# Patient Record
Sex: Female | Born: 1970 | Race: White | Hispanic: No | Marital: Married | Smoking: Never smoker
Health system: Southern US, Community
[De-identification: ages and names within clinical notes are randomized; demographics above are authoritative.]

## PROBLEM LIST (undated history)

## (undated) DIAGNOSIS — F419 Anxiety disorder, unspecified: Secondary | ICD-10-CM

## (undated) DIAGNOSIS — D649 Anemia, unspecified: Secondary | ICD-10-CM

## (undated) DIAGNOSIS — I341 Nonrheumatic mitral (valve) prolapse: Secondary | ICD-10-CM

## (undated) DIAGNOSIS — R0600 Dyspnea, unspecified: Secondary | ICD-10-CM

## (undated) DIAGNOSIS — R011 Cardiac murmur, unspecified: Secondary | ICD-10-CM

## (undated) HISTORY — PX: DIAGNOSTIC LAPAROSCOPY: SUR761

## (undated) HISTORY — PX: TUBAL LIGATION: SHX77

## (undated) HISTORY — PX: NOVASURE ABLATION: SHX5394

## (undated) HISTORY — PX: CYSTECTOMY: SUR359

## (undated) HISTORY — PX: ABDOMINAL HYSTERECTOMY: SHX81

---

## 2005-02-14 ENCOUNTER — Emergency Department: Payer: Self-pay | Admitting: Emergency Medicine

## 2005-08-01 ENCOUNTER — Emergency Department: Payer: Self-pay | Admitting: Internal Medicine

## 2005-08-01 ENCOUNTER — Other Ambulatory Visit: Payer: Self-pay

## 2006-05-03 ENCOUNTER — Ambulatory Visit: Payer: Self-pay | Admitting: Internal Medicine

## 2006-05-11 ENCOUNTER — Ambulatory Visit: Payer: Self-pay | Admitting: Internal Medicine

## 2006-11-02 ENCOUNTER — Emergency Department: Payer: Self-pay | Admitting: Emergency Medicine

## 2009-08-21 ENCOUNTER — Emergency Department: Payer: Self-pay | Admitting: Unknown Physician Specialty

## 2010-09-12 ENCOUNTER — Emergency Department: Payer: Self-pay | Admitting: Emergency Medicine

## 2012-03-05 ENCOUNTER — Emergency Department: Payer: Self-pay | Admitting: Emergency Medicine

## 2012-03-14 ENCOUNTER — Emergency Department: Payer: Self-pay | Admitting: Emergency Medicine

## 2013-05-30 ENCOUNTER — Emergency Department: Payer: Self-pay | Admitting: Emergency Medicine

## 2013-05-30 LAB — COMPREHENSIVE METABOLIC PANEL
Albumin: 3.9 g/dL (ref 3.4–5.0)
Anion Gap: 9 (ref 7–16)
Bilirubin,Total: 1.5 mg/dL — ABNORMAL HIGH (ref 0.2–1.0)
Calcium, Total: 8.9 mg/dL (ref 8.5–10.1)
Chloride: 103 mmol/L (ref 98–107)
EGFR (African American): >60
EGFR (Non-African Amer.): >60
Glucose: 89 mg/dL (ref 65–99)
Potassium: 3.4 mmol/L — ABNORMAL LOW (ref 3.5–5.1)
SGPT (ALT): 28 U/L (ref 12–78)

## 2013-05-30 LAB — CBC
HCT: 33.3 % — ABNORMAL LOW (ref 35.0–47.0)
HGB: 11.4 g/dL — ABNORMAL LOW (ref 12.0–16.0)
MCH: 31.2 pg (ref 26.0–34.0)
MCHC: 34.3 g/dL (ref 32.0–36.0)
MCV: 91 fL (ref 80–100)
Platelet: 221 10*3/uL (ref 150–440)
RDW: 14.4 % (ref 11.5–14.5)
WBC: 7.9 10*3/uL (ref 3.6–11.0)

## 2013-05-30 LAB — CK TOTAL AND CKMB (NOT AT ARMC): CK, Total: 85 U/L (ref 21–215)

## 2013-05-30 LAB — TROPONIN I
Troponin-I: <0.02 ng/mL
Troponin-I: <0.02 ng/mL

## 2014-11-30 ENCOUNTER — Emergency Department: Payer: Self-pay | Admitting: Emergency Medicine

## 2019-05-11 ENCOUNTER — Emergency Department: Payer: Self-pay

## 2019-05-11 ENCOUNTER — Other Ambulatory Visit: Payer: Self-pay

## 2019-05-11 ENCOUNTER — Emergency Department
Admission: EM | Admit: 2019-05-11 | Discharge: 2019-05-11 | Disposition: A | Discharge status: Home / Self Care | Payer: Self-pay | Attending: Emergency Medicine | Admitting: Emergency Medicine

## 2019-05-11 ENCOUNTER — Encounter: Payer: Self-pay | Admitting: Emergency Medicine

## 2019-05-11 DIAGNOSIS — T148XXA Other injury of unspecified body region, initial encounter: Secondary | ICD-10-CM | POA: Insufficient documentation

## 2019-05-11 DIAGNOSIS — T07XXXA Unspecified multiple injuries, initial encounter: Secondary | ICD-10-CM

## 2019-05-11 DIAGNOSIS — Y929 Unspecified place or not applicable: Secondary | ICD-10-CM | POA: Insufficient documentation

## 2019-05-11 DIAGNOSIS — S93402A Sprain of unspecified ligament of left ankle, initial encounter: Secondary | ICD-10-CM | POA: Insufficient documentation

## 2019-05-11 DIAGNOSIS — Y999 Unspecified external cause status: Secondary | ICD-10-CM | POA: Insufficient documentation

## 2019-05-11 DIAGNOSIS — W0110XA Fall on same level from slipping, tripping and stumbling with subsequent striking against unspecified object, initial encounter: Secondary | ICD-10-CM | POA: Insufficient documentation

## 2019-05-11 DIAGNOSIS — Y939 Activity, unspecified: Secondary | ICD-10-CM | POA: Insufficient documentation

## 2019-05-11 DIAGNOSIS — S8991XA Unspecified injury of right lower leg, initial encounter: Secondary | ICD-10-CM | POA: Insufficient documentation

## 2019-05-11 HISTORY — DX: Nonrheumatic mitral (valve) prolapse: I34.1

## 2019-05-11 NOTE — ED Notes (Signed)
See triage note  Presents s/p fall 1 week ago  Landed on left knee   Bruising noted to knee  No deformity note  ambulates with slight limp d/t pain

## 2019-05-11 NOTE — ED Provider Notes (Signed)
[facility] Emergency Department Provider Note ____________________________________________  Time seen: Approximately 3:04 PM  I have reviewed the triage vital signs and the nursing notes.   HISTORY  Chief Complaint Knee Pain    HPI Alyssa Salazar is a 48 y.o. female who presents to the emergency department for evaluation and treatment of pain in the right knee and left ankle after a mechanical, non-syncopal fall at work on 05/04/2019.  Patient states that she tripped on a baseboard that is not flush to the ground. She fell forward and struck her right arm on something and landed directly on her right knee. She believes her right foot sort of hit her left ankle during all this and the ankle is tender as well. No relief with OTC medications.   Past Medical History:  Diagnosis Date  . Mitral valve prolapse     There are no active problems to display for this patient.   History reviewed. No pertinent surgical history.  Prior to Admission medications   Not on File    Allergies Penicillins  History reviewed. No pertinent family history.  Social History Social History   Tobacco Use  . Smoking status: Never Smoker  . Smokeless tobacco: Never Used  Substance Use Topics  . Alcohol use: Never    Frequency: Never  . Drug use: Never    Review of Systems Constitutional: Negative for fever. Cardiovascular: Negative for chest pain. Respiratory: Negative for shortness of breath. Musculoskeletal: Positive for right forearm, right knee, and left ankle pain. Skin: Positive for contusions to the right forearm and right knee. Neurological: Negative for decrease in sensation  ____________________________________________   PHYSICAL EXAM:  VITAL SIGNS: ED Triage Vitals  Enc Vitals Group     BP 05/11/19 1342 (!) 147/83     Pulse Rate 05/11/19 1342 89     Resp 05/11/19 1342 16     Temp 05/11/19 1342 98.2 F (36.8 C)     Temp Source 05/11/19 1342 Oral      SpO2 05/11/19 1342 99 %     Weight 05/11/19 1343 180 lb (81.6 kg)     Height 05/11/19 1343 5\' 5"  (1.651 m)     Head Circumference --      Peak Flow --      Pain Score 05/11/19 1342 6     Pain Loc --      Pain Edu? --      Excl. in [place]? --     Constitutional: Alert and oriented. Well appearing and in no acute distress. Eyes: Conjunctivae are clear without discharge or drainage Head: Atraumatic Neck: Supple.  No midline tenderness. Respiratory: No cough. Respirations are even and unlabored. Musculoskeletal: Focal tenderness over the anterior aspect of the right knee in the area of tibial plateau.  Diffuse tenderness over the medial aspect of the left ankle Neurologic: Motor and sensory function is intact. Skin: No open wounds or lesions.  Widespread contusions over the forearm and right knee Psychiatric: Affect and behavior are appropriate.  ____________________________________________   LABS (all labs ordered are listed, but only abnormal results are displayed)  Labs Reviewed - No data to display ____________________________________________  RADIOLOGY  No focal bony abnormality of the right knee or left ankle per radiology. ____________________________________________   PROCEDURES  Procedures  ____________________________________________   INITIAL IMPRESSION / ASSESSMENT AND PLAN / ED COURSE  Alyssa Salazar is a 48 y.o. who presents to the emergency department for treatment and evaluation of right knee pain  and left ankle pain after a fall on 05/04/2019 while at work.  Exam is concerning for a tibial plateau fracture or injury.  X-ray is concerning for a very subtle nondisplaced fracture over the tibial plateau.  Patient was placed in a Jones wrap over the right knee.  Left ankle pain is likely secondary to sprain.  She was placed in an ankle stirrup splint.  She is to follow-up with orthopedics if not improving over the week.  She was provided with a work excuse for  the next 3 days.  She was also sent medications to the pharmacy as below.  She was encouraged to return to the emergency department for symptoms of change or worsen if she is unable to schedule an appointment with her primary care provider or orthopedics.  Medications - No data to display  Pertinent labs & imaging results that were available during my care of the patient were reviewed by me and considered in my medical decision making (see chart for details).  _________________________________________   FINAL CLINICAL IMPRESSION(S) / ED DIAGNOSES  Final diagnoses:  Knee injury, right, initial encounter  Sprain of left ankle, unspecified ligament, initial encounter  Contusion, multiple sites    ED Discharge Orders    None       If controlled substance prescribed during this visit, 12 month history viewed on the [place] prior to issuing an initial prescription for Schedule II or III opiod.   Victorino Dike, FNP 05/11/19 1606    Carrie Mew, MD 05/13/19 [phone]

## 2019-05-11 NOTE — ED Triage Notes (Addendum)
Pt tripped on floor at work, pt works at [employer].  [address] 05/04/19.  Still having pain to right knee. Bruising noted to right knee and right forearm.  No deformity.  Called their nurse line and they told her to ice it but she is still having pain.  Also has pain to left ankle.

## 2019-05-11 NOTE — Discharge Instructions (Signed)
Please follow-up with orthopedics for symptoms that are not improving over the next few days.  Rest, ice, and elevate your extremities as often as possible over the next few days.  Return to the emergency department for symptoms of change or worsen if you are unable to schedule an appointment with your primary care provider or the orthopedic specialist.

## 2019-09-30 IMAGING — DX LEFT ANKLE COMPLETE - 3+ VIEW
3 series · 3 of 3 positions shown · non-contrast
Comparison: None.

CLINICAL DATA: Tripped and fell at work 05/04/2019

EXAM:
LEFT ANKLE COMPLETE - 3+ VIEW

[ankle ap]
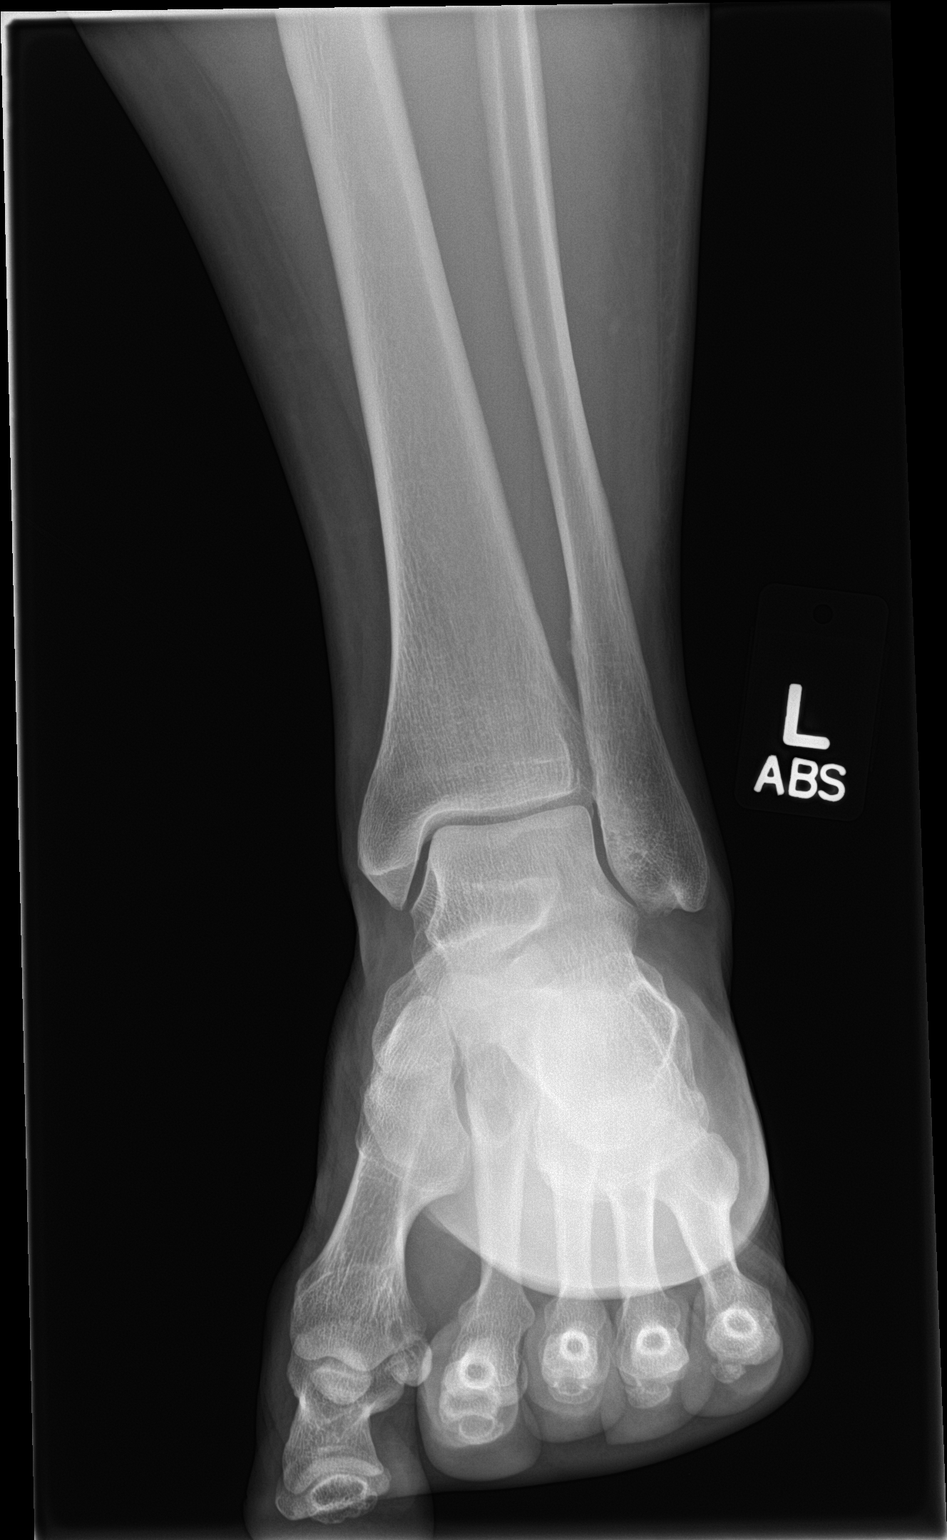

[ankle obl]
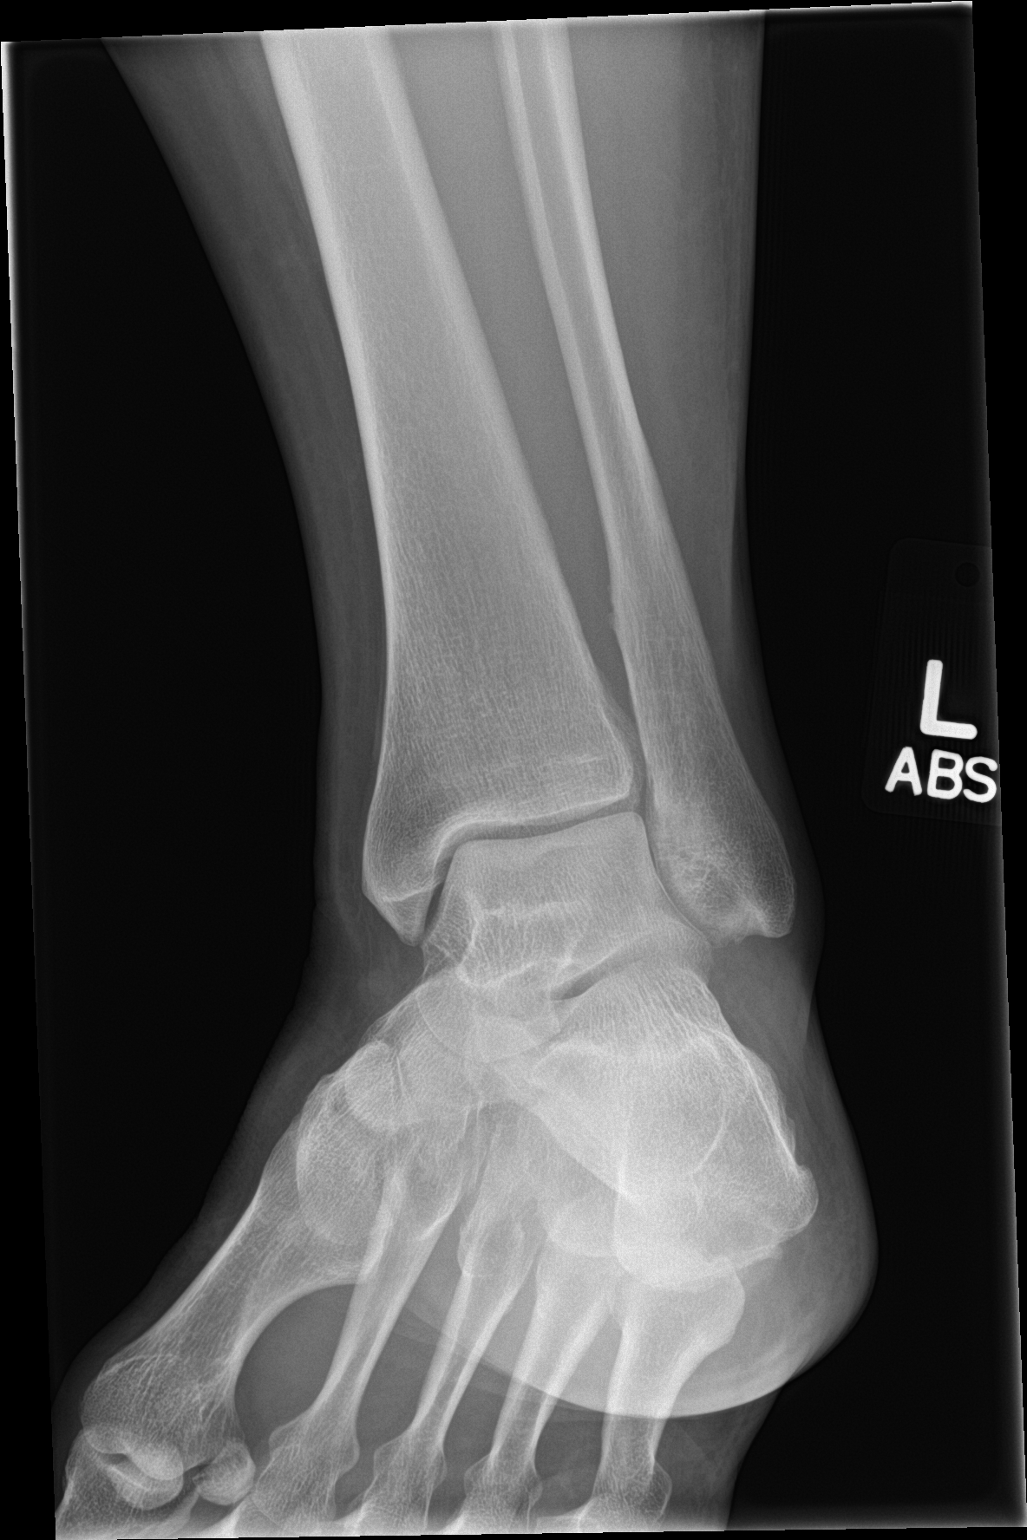

[ankle lat]
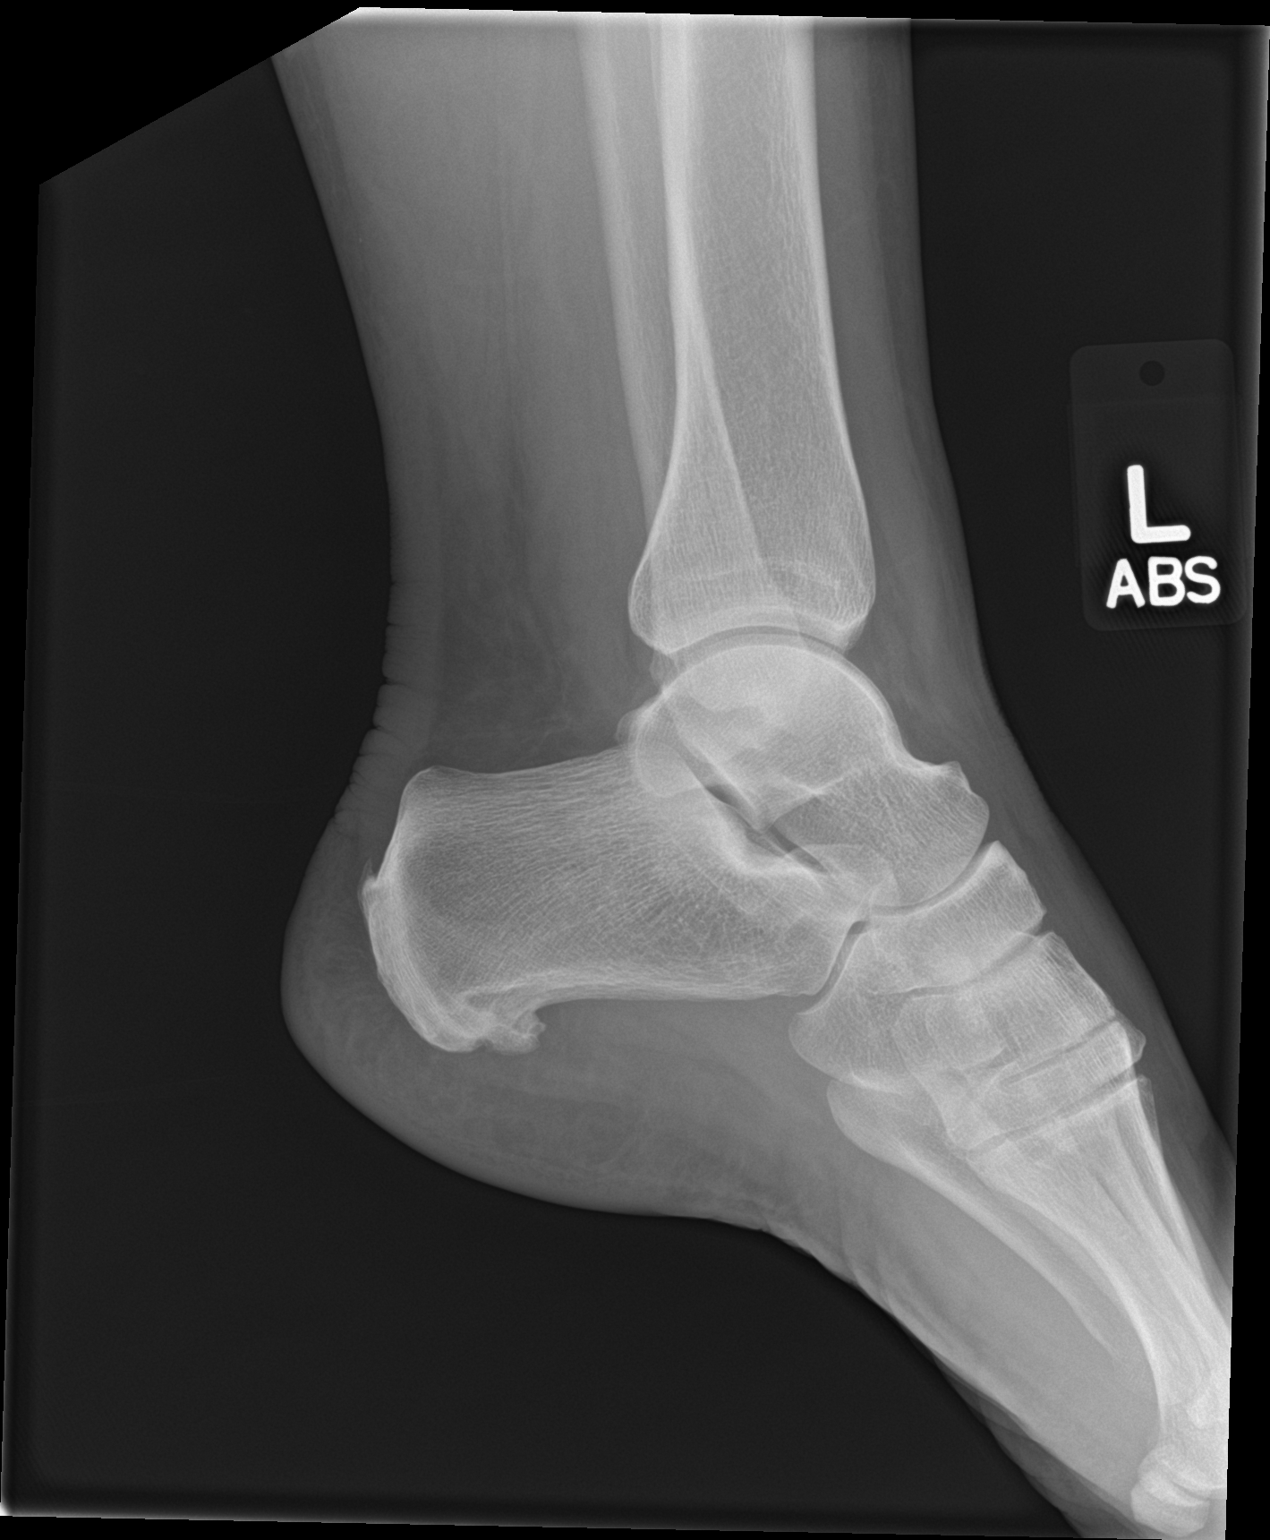

[3 of 3 positions shown; findings below may reference images not displayed]

FINDINGS: The ankle mortise is maintained. No acute ankle fracture or
osteochondral abnormality. The subtalar joints are maintained. Mild
Asiya Wegner. Calcaneal spurring changes are noted.
IMPRESSION: No acute bony findings.

## 2020-02-21 ENCOUNTER — Other Ambulatory Visit: Payer: Self-pay | Admitting: Internal Medicine

## 2020-02-21 DIAGNOSIS — Z0000 Encounter for general adult medical examination without abnormal findings: Secondary | ICD-10-CM

## 2020-02-28 ENCOUNTER — Inpatient Hospital Stay
Admission: RE | Admit: 2020-02-28 | Discharge: 2020-02-28 | Disposition: A | Discharge status: Home / Self Care | Payer: Self-pay | Source: Ambulatory Visit | Attending: *Deleted | Admitting: *Deleted

## 2020-02-28 ENCOUNTER — Other Ambulatory Visit: Payer: Self-pay | Admitting: *Deleted

## 2020-02-28 DIAGNOSIS — Z1231 Encounter for screening mammogram for malignant neoplasm of breast: Secondary | ICD-10-CM

## 2020-03-06 ENCOUNTER — Other Ambulatory Visit: Payer: Self-pay

## 2020-03-06 ENCOUNTER — Ambulatory Visit
Admission: RE | Admit: 2020-03-06 | Discharge: 2020-03-06 | Disposition: A | Discharge status: Home / Self Care | Payer: 59 | Source: Ambulatory Visit | Attending: Internal Medicine | Admitting: Internal Medicine

## 2020-03-06 DIAGNOSIS — Z0000 Encounter for general adult medical examination without abnormal findings: Secondary | ICD-10-CM

## 2020-03-06 DIAGNOSIS — Z1231 Encounter for screening mammogram for malignant neoplasm of breast: Secondary | ICD-10-CM | POA: Insufficient documentation

## 2020-04-19 ENCOUNTER — Other Ambulatory Visit: Payer: Self-pay | Admitting: Obstetrics and Gynecology

## 2020-05-15 ENCOUNTER — Other Ambulatory Visit: Payer: 59

## 2020-05-23 ENCOUNTER — Other Ambulatory Visit: Payer: 59

## 2020-05-27 NOTE — H&P (Signed)
Chief Complaint:   Alyssa Salazar is a 49 y.o. female presenting with Pre Op Consulting   History of Present Illness: Patient returns for a preoperative visit for a Total Laparoscopic Hysterectomy with Bilateral Salpingectomy. Indications are menorrhagia, uterine fibroids, s/p failed ablation 6 years ago; patient declines hormonal management.   I sent in Aygestin 5mg  04/16/2020 to manage patient's bleeding until surgery, but she is still having clotting that annoys.  Workup: Pap:03/2020 neg/neg EMBx: collect today TVUS 03/2020: Lt lat  Ant fibroid= 16 mm Endometrium=13.88 mm bil ovs wnl  Pertinent hx: -Hx of Laparoscopic Ovarian Cystectomy -S/p Novasure at [facility] 6 years ago and helped some with period but her periods are progressively getting heavier again -S/p BTLin 1994 -NSVD x3, includes twins    Past Medical History:  has a past medical history of Anemia, Anxiety (012020), and Heart murmur, unspecified (1990).  Past Surgical History:  has a past surgical history that includes Laparoscopic ovarian cystectomy (Right); Tubal ligation (Bilateral, 05/14/1993); and Hysteroscopy w/ endometrial ablation (N/A, 2015). Family History: family history includes COPD in her father and mother; Diabetes type II in her father; Heart disease in her mother; Lung cancer in her father; Prostate cancer in her maternal grandfather; Stroke in her mother; Ulcers in her mother. Social History:  reports that she has never smoked. She has never used smokeless tobacco. She reports that she does not drink alcohol and does not use drugs. OB/GYN History:  OB History    Gravida  3   Para  3   Term  2   Preterm  1   AB      Living  4     SAB      TAB      Ectopic      Molar      Multiple  1   Live Births  4         Allergies: is allergic to iodinated contrast media and penicillins. Medications: Current Outpatient Medications:  .  escitalopram oxalate (LEXAPRO) 10 MG tablet, Take 1  tablet (10 mg total) by mouth once daily for 90 days, Disp: 90 tablet, Rfl: 11 .  norethindrone (AYGESTIN) 5 mg tablet, Take 1 tablet (5 mg total) by mouth once daily, Disp: 30 tablet, Rfl: 2 .  valACYclovir (VALTREX) 1000 MG tablet, Take 1 tablet (1,000 mg total) by mouth 3 (three) times daily as needed, Disp: 30 tablet, Rfl: 11  Review of Systems: No SOB, no palpitations or chest pain, no new lower extremity edema, no nausea or vomiting or bowel or bladder complaints. See HPI for gyn specific ROS.   Exam:   BP 144/79   Pulse 81   Ht 160 cm (5\' 3" )   Wt 89.8 kg (198 lb)   BMI 35.07 kg/m   General: Patient is well-groomed, well-nourished, appears stated age in no acute distress   HEENT: head is atraumatic and normocephalic, trachea is midline, neck is supple with no palpable nodules   CV: Regular rhythm and normal heart rate, no murmur   Pulm: Clear to auscultation throughout lung fields with no wheezing, crackles, or rhonchi. No increased work of breathing  Abdomen: soft , no mass, non-tender, no rebound tenderness, no hepatomegaly  Pelvic: tanner stage 5 ,   External genitalia: vulva /labia no lesions  Urethra: no prolapse  Vagina: normal physiologic d/c, laxity in vaginal walls  Cervix: no lesions, no cervical motion tenderness, good descent  Uterus: normal size shape and contour, non-tender  Adnexa:  no mass,  non-tender    Rectovaginal: External wnl embx collected  Impression:   The encounter diagnosis was Excessive or frequent menstruation.    Plan:    Patient returns for a preoperative discussion regarding her plans to proceed with surgical treatment of her menorrhagia by total laparoscopic hysterectomy with bilateral salpingectomy procedure.  We may perform a cystoscopy to evaluate the urinary tract after the procedure, if surgically indicated for uro tract integrity.   The patient and I discussed the technical aspects of the procedure including the potential for  risks and complications.  These include but are not limited to the risk of infection requiring post-operative antibiotics or further procedures.  We talked about the risk of injury to adjacent organs including bladder, bowel, ureter, blood vessels or nerves.  We talked about the need to convert to an open incision.  We talked about the possible need for blood transfusion.  We talked about postop complications such as thromboembolic or cardiopulmonary complications.  All of her questions were answered.  Her preoperative exam was completed and the appropriate consents were signed. She is scheduled to undergo this procedure in the near future.  Specific Peri-operative Considerations:  - Consent: obtained today - Health Maintenance: emb today - Labs: CBC, CMP preoperatively - Studies: EKG, CXR preoperatively - Bowel Preparation: None required - Abx:  Ancef 2g - VTE ppx: SCDs perioperatively - Glucose Protocol: n/a - Beta-blockade: n/a    Diagnoses and all orders for this visit:  Excessive or frequent menstruation -     Pathology Report - [facility]    Return for Postop check.  Sherrie George, MD

## 2020-05-30 ENCOUNTER — Encounter
Admission: RE | Admit: 2020-05-30 | Discharge: 2020-05-30 | Disposition: A | Discharge status: Home / Self Care | Payer: No Typology Code available for payment source | Source: Ambulatory Visit | Attending: Obstetrics and Gynecology | Admitting: Obstetrics and Gynecology

## 2020-05-30 ENCOUNTER — Other Ambulatory Visit: Payer: Self-pay

## 2020-05-30 DIAGNOSIS — Z01818 Encounter for other preprocedural examination: Secondary | ICD-10-CM | POA: Insufficient documentation

## 2020-05-30 DIAGNOSIS — I341 Nonrheumatic mitral (valve) prolapse: Secondary | ICD-10-CM | POA: Diagnosis not present

## 2020-05-30 HISTORY — DX: Anemia, unspecified: D64.9

## 2020-05-30 HISTORY — DX: Dyspnea, unspecified: R06.00

## 2020-05-30 HISTORY — DX: Anxiety disorder, unspecified: F41.9

## 2020-05-30 LAB — TYPE AND SCREEN
ABO/RH(D): O POS
Antibody Screen: NEGATIVE

## 2020-05-30 LAB — BASIC METABOLIC PANEL
Anion gap: 6 (ref 5–15)
BUN: 16 mg/dL (ref 6–20)
CO2: 26 mmol/L (ref 22–32)
Calcium: 8.5 mg/dL — ABNORMAL LOW (ref 8.9–10.3)
Chloride: 106 mmol/L (ref 98–111)
Creatinine, Ser: 0.87 mg/dL (ref 0.44–1.00)
GFR calc Af Amer: >60 mL/min (ref >60)
GFR calc non Af Amer: >60 mL/min (ref >60)
Glucose, Bld: 98 mg/dL (ref 70–99)
Potassium: 3.8 mmol/L (ref 3.5–5.1)
Sodium: 138 mmol/L (ref 135–145)

## 2020-05-30 LAB — CBC
HCT: 42.2 % (ref 36.0–46.0)
Hemoglobin: 13.4 g/dL (ref 12.0–15.0)
MCH: 26.9 pg (ref 26.0–34.0)
MCHC: 31.8 g/dL (ref 30.0–36.0)
MCV: 84.6 fL (ref 80.0–100.0)
Platelets: 219 10*3/uL (ref 150–400)
RBC: 4.99 MIL/uL (ref 3.87–5.11)
RDW: 13.2 % (ref 11.5–15.5)
WBC: 5.7 10*3/uL (ref 4.0–10.5)
nRBC: 0 % (ref 0.0–0.2)

## 2020-05-30 NOTE — Patient Instructions (Addendum)
Your procedure is scheduled on: 06/10/20 Report to [facility]. To find out your arrival time please call [phone] between 1PM - 3PM on 06/07/20 .  Remember: Instructions that are not followed completely may result in serious medical risk, up to and including death, or upon the discretion of your surgeon and anesthesiologist your surgery may need to be rescheduled.     _X__ 1. Do not eat food after midnight the night before your procedure.                 No gum chewing or hard candies. You may drink clear liquids up to 2 hours                 before you are scheduled to arrive for your surgery- DO not drink clear                 liquids within 2 hours of the start of your surgery.                 Clear Liquids include:  water, apple juice without pulp, clear carbohydrate                 drink such as Clearfast or Gatorade, Black Coffee or Tea (Do not add                 anything to coffee or tea). Diabetics water only  __X__2.  On the morning of surgery brush your teeth with toothpaste and water, you                 may rinse your mouth with mouthwash if you wish.  Do not swallow any              toothpaste of mouthwash.     _X__ 3.  No Alcohol for 24 hours before or after surgery.   _X__ 4.  Do Not Smoke or use e-cigarettes For 24 Hours Prior to Your Surgery.                 Do not use any chewable tobacco products for at least 6 hours prior to                 surgery.  ____  5.  Bring all medications with you on the day of surgery if instructed.   __X__  6.  Notify your doctor if there is any change in your medical condition      (cold, fever, infections).     Do not wear jewelry, make-up, hairpins, clips or nail polish. Do not wear lotions, powders, or perfumes.  Do not shave 48 hours prior to surgery. Men may shave face and neck. Do not bring valuables to the hospital.    [facility] is not responsible for any belongings  or valuables.  Contacts, dentures/partials or body piercings may not be worn into surgery. Bring a case for your contacts, glasses or hearing aids, a denture cup will be supplied. Leave your suitcase in the car. After surgery it may be brought to your room. For patients admitted to the hospital, discharge time is determined by your treatment team.   Patients discharged the day of surgery will not be allowed to drive home.   Please read over the following fact sheets that you were given:   MRSA Information  __X__ Take these medicines the morning of surgery with A SIP OF WATER:  1. none  2.   3.   4.  5.  6.  ____ Fleet Enema (as directed)   __X__ Use CHG Soap/SAGE wipes as directed  ____ Use inhalers on the day of surgery  ____ Stop metformin/Janumet/Farxiga 2 days prior to surgery    ____ Take 1/2 of usual insulin dose the night before surgery. No insulin the morning          of surgery.   ____ Stop Blood Thinners Coumadin/Plavix/Xarelto/Pleta/Pradaxa/Eliquis/Effient/Aspirin  on   Or contact your Surgeon, Cardiologist or Medical Doctor regarding  ability to stop your blood thinners  __X__ Stop Anti-inflammatories 7 days before surgery such as Advil, Ibuprofen, Motrin,  BC or Goodies Powder, Naprosyn, Naproxen, Aleve, Aspirin    __X__ Stop all herbal supplements, fish oil or vitamin E until after surgery.    ____ Bring C-Pap to the hospital.    The Ensure Pre Surgery drink should be completed 2 hours prior to arriving for surgery

## 2020-06-06 ENCOUNTER — Other Ambulatory Visit
Admission: RE | Admit: 2020-06-06 | Discharge: 2020-06-06 | Disposition: A | Discharge status: Home / Self Care | Payer: No Typology Code available for payment source | Source: Ambulatory Visit | Attending: Obstetrics and Gynecology | Admitting: Obstetrics and Gynecology

## 2020-06-06 ENCOUNTER — Other Ambulatory Visit: Payer: Self-pay

## 2020-06-06 DIAGNOSIS — Z01812 Encounter for preprocedural laboratory examination: Secondary | ICD-10-CM | POA: Insufficient documentation

## 2020-06-06 DIAGNOSIS — Z20822 Contact with and (suspected) exposure to covid-19: Secondary | ICD-10-CM | POA: Diagnosis not present

## 2020-06-06 LAB — SARS CORONAVIRUS 2 (TAT 6-24 HRS): SARS Coronavirus 2: NEGATIVE

## 2020-06-10 ENCOUNTER — Ambulatory Visit
Admission: RE | Admit: 2020-06-10 | Discharge: 2020-06-10 | Disposition: A | Discharge status: Home / Self Care | Payer: No Typology Code available for payment source | Attending: Obstetrics and Gynecology | Admitting: Obstetrics and Gynecology

## 2020-06-10 ENCOUNTER — Encounter
Admission: RE | Disposition: A | Discharge status: Home / Self Care | Payer: Self-pay | Source: Home / Self Care | Attending: Obstetrics and Gynecology

## 2020-06-10 ENCOUNTER — Ambulatory Visit: Payer: No Typology Code available for payment source | Admitting: Anesthesiology

## 2020-06-10 ENCOUNTER — Other Ambulatory Visit: Payer: Self-pay

## 2020-06-10 ENCOUNTER — Encounter: Payer: Self-pay | Admitting: Obstetrics and Gynecology

## 2020-06-10 DIAGNOSIS — Z793 Long term (current) use of hormonal contraceptives: Secondary | ICD-10-CM | POA: Diagnosis not present

## 2020-06-10 DIAGNOSIS — Z79899 Other long term (current) drug therapy: Secondary | ICD-10-CM | POA: Insufficient documentation

## 2020-06-10 DIAGNOSIS — N72 Inflammatory disease of cervix uteri: Secondary | ICD-10-CM | POA: Diagnosis not present

## 2020-06-10 DIAGNOSIS — N852 Hypertrophy of uterus: Secondary | ICD-10-CM | POA: Diagnosis not present

## 2020-06-10 DIAGNOSIS — D251 Intramural leiomyoma of uterus: Secondary | ICD-10-CM | POA: Insufficient documentation

## 2020-06-10 DIAGNOSIS — N920 Excessive and frequent menstruation with regular cycle: Secondary | ICD-10-CM | POA: Diagnosis present

## 2020-06-10 DIAGNOSIS — Z91041 Radiographic dye allergy status: Secondary | ICD-10-CM | POA: Insufficient documentation

## 2020-06-10 DIAGNOSIS — Z880 Allergy status to penicillin: Secondary | ICD-10-CM | POA: Insufficient documentation

## 2020-06-10 DIAGNOSIS — F419 Anxiety disorder, unspecified: Secondary | ICD-10-CM | POA: Diagnosis not present

## 2020-06-10 HISTORY — PX: LAPAROSCOPIC HYSTERECTOMY: SHX1926

## 2020-06-10 HISTORY — PX: LAPAROSCOPIC BILATERAL SALPINGECTOMY: SHX5889

## 2020-06-10 LAB — POCT PREGNANCY, URINE: Preg Test, Ur: NEGATIVE

## 2020-06-10 LAB — ABO/RH: ABO/RH(D): O POS

## 2020-06-10 SURGERY — HYSTERECTOMY, TOTAL, LAPAROSCOPIC
Anesthesia: General

## 2020-06-10 MED ORDER — LIDOCAINE HCL (PF) 2 % IJ SOLN
INTRAMUSCULAR | Status: AC
  Filled 2020-06-10: qty 5

## 2020-06-10 MED ORDER — CHLORHEXIDINE GLUCONATE 0.12 % MT SOLN
OROMUCOSAL | Status: AC
  Filled 2020-06-10: qty 15

## 2020-06-10 MED ORDER — OXYCODONE HCL 5 MG PO TABS
5.0000 mg | ORAL_TABLET | Freq: Once | ORAL | Status: AC | PRN
  Administered 2020-06-10: 5 mg via ORAL

## 2020-06-10 MED ORDER — ONDANSETRON HCL 4 MG/2ML IJ SOLN: 4.0000 mg | Freq: Once | INTRAMUSCULAR | Status: DC | PRN

## 2020-06-10 MED ORDER — FENTANYL CITRATE (PF) 100 MCG/2ML IJ SOLN
INTRAMUSCULAR | Status: AC
  Filled 2020-06-10: qty 2

## 2020-06-10 MED ORDER — ACETAMINOPHEN 500 MG PO TABS
1000.0000 mg | ORAL_TABLET | Freq: Four times a day (QID) | ORAL | 0 refills | Status: AC
Start: 2020-06-10 — End: 2020-06-13

## 2020-06-10 MED ORDER — ONDANSETRON HCL 4 MG/2ML IJ SOLN
INTRAMUSCULAR | Status: DC | PRN
  Administered 2020-06-10: 4 mg via INTRAVENOUS

## 2020-06-10 MED ORDER — LACTATED RINGERS IV SOLN: INTRAVENOUS | Status: DC

## 2020-06-10 MED ORDER — DEXAMETHASONE SODIUM PHOSPHATE 10 MG/ML IJ SOLN
INTRAMUSCULAR | Status: AC
  Filled 2020-06-10: qty 1

## 2020-06-10 MED ORDER — LIDOCAINE HCL (CARDIAC) PF 100 MG/5ML IV SOSY
PREFILLED_SYRINGE | INTRAVENOUS | Status: DC | PRN
  Administered 2020-06-10: 100 mg via INTRAVENOUS

## 2020-06-10 MED ORDER — FENTANYL CITRATE (PF) 100 MCG/2ML IJ SOLN
INTRAMUSCULAR | Status: DC | PRN
  Administered 2020-06-10 (×3): 50 ug via INTRAVENOUS

## 2020-06-10 MED ORDER — EPHEDRINE 5 MG/ML INJ
INTRAVENOUS | Status: AC
  Filled 2020-06-10: qty 10

## 2020-06-10 MED ORDER — IBUPROFEN 800 MG PO TABS: 800.0000 mg | ORAL_TABLET | Freq: Three times a day (TID) | ORAL | 1 refills | Status: AC

## 2020-06-10 MED ORDER — OXYCODONE HCL 5 MG/5ML PO SOLN: 5.0000 mg | Freq: Once | ORAL | Status: AC | PRN

## 2020-06-10 MED ORDER — FAMOTIDINE 20 MG PO TABS
20.0000 mg | ORAL_TABLET | Freq: Once | ORAL | Status: AC
  Administered 2020-06-10: 20 mg via ORAL

## 2020-06-10 MED ORDER — ORAL CARE MOUTH RINSE: 15.0000 mL | Freq: Once | OROMUCOSAL | Status: AC

## 2020-06-10 MED ORDER — PROPOFOL 10 MG/ML IV BOLUS
INTRAVENOUS | Status: DC | PRN
  Administered 2020-06-10: 170 mg via INTRAVENOUS

## 2020-06-10 MED ORDER — FENTANYL CITRATE (PF) 100 MCG/2ML IJ SOLN
25.0000 ug | INTRAMUSCULAR | Status: DC | PRN
  Administered 2020-06-10 (×2): 50 ug via INTRAVENOUS

## 2020-06-10 MED ORDER — ACETAMINOPHEN 10 MG/ML IV SOLN: 1000.0000 mg | Freq: Once | INTRAVENOUS | Status: DC | PRN

## 2020-06-10 MED ORDER — CHLORHEXIDINE GLUCONATE 0.12 % MT SOLN
15.0000 mL | Freq: Once | OROMUCOSAL | Status: AC
  Administered 2020-06-10: 15 mL via OROMUCOSAL

## 2020-06-10 MED ORDER — BUPIVACAINE HCL (PF) 0.5 % IJ SOLN
INTRAMUSCULAR | Status: AC
  Filled 2020-06-10: qty 30

## 2020-06-10 MED ORDER — OXYCODONE HCL 5 MG PO TABS: 5.0000 mg | ORAL_TABLET | ORAL | 0 refills | Status: AC | PRN

## 2020-06-10 MED ORDER — MIDAZOLAM HCL 2 MG/2ML IJ SOLN
INTRAMUSCULAR | Status: DC | PRN
  Administered 2020-06-10: 2 mg via INTRAVENOUS

## 2020-06-10 MED ORDER — OXYCODONE HCL 5 MG PO TABS
ORAL_TABLET | ORAL | Status: AC
  Filled 2020-06-10: qty 1

## 2020-06-10 MED ORDER — DEXAMETHASONE SODIUM PHOSPHATE 10 MG/ML IJ SOLN
INTRAMUSCULAR | Status: DC | PRN
  Administered 2020-06-10: 8 mg via INTRAVENOUS

## 2020-06-10 MED ORDER — MIDAZOLAM HCL 2 MG/2ML IJ SOLN
INTRAMUSCULAR | Status: AC
  Filled 2020-06-10: qty 2

## 2020-06-10 MED ORDER — GABAPENTIN 800 MG PO TABS: 800.0000 mg | ORAL_TABLET | Freq: Every day | ORAL | 0 refills | Status: AC

## 2020-06-10 MED ORDER — EPHEDRINE SULFATE 50 MG/ML IJ SOLN
INTRAMUSCULAR | Status: DC | PRN
  Administered 2020-06-10: 5 mg via INTRAVENOUS

## 2020-06-10 MED ORDER — PROPOFOL 10 MG/ML IV BOLUS
INTRAVENOUS | Status: AC
  Filled 2020-06-10: qty 20

## 2020-06-10 MED ORDER — DOCUSATE SODIUM 100 MG PO CAPS
100.0000 mg | ORAL_CAPSULE | Freq: Two times a day (BID) | ORAL | 0 refills | Status: AC
Start: 2020-06-10 — End: ?

## 2020-06-10 MED ORDER — CEFAZOLIN SODIUM-DEXTROSE 2-4 GM/100ML-% IV SOLN
INTRAVENOUS | Status: AC
  Filled 2020-06-10: qty 100

## 2020-06-10 MED ORDER — ROCURONIUM BROMIDE 100 MG/10ML IV SOLN
INTRAVENOUS | Status: DC | PRN
  Administered 2020-06-10: 20 mg via INTRAVENOUS
  Administered 2020-06-10: 40 mg via INTRAVENOUS
  Administered 2020-06-10: 20 mg via INTRAVENOUS

## 2020-06-10 MED ORDER — CEFAZOLIN SODIUM-DEXTROSE 2-4 GM/100ML-% IV SOLN
2.0000 g | INTRAVENOUS | Status: AC
  Administered 2020-06-10: 2 g via INTRAVENOUS

## 2020-06-10 MED ORDER — ROCURONIUM BROMIDE 10 MG/ML (PF) SYRINGE
PREFILLED_SYRINGE | INTRAVENOUS | Status: AC
  Filled 2020-06-10: qty 10

## 2020-06-10 MED ORDER — SUGAMMADEX SODIUM 200 MG/2ML IV SOLN
INTRAVENOUS | Status: DC | PRN
  Administered 2020-06-10: 200 mg via INTRAVENOUS

## 2020-06-10 MED ORDER — BUPIVACAINE HCL 0.5 % IJ SOLN
INTRAMUSCULAR | Status: DC | PRN
  Administered 2020-06-10: 15 mL

## 2020-06-10 MED ORDER — FAMOTIDINE 20 MG PO TABS
ORAL_TABLET | ORAL | Status: AC
  Filled 2020-06-10: qty 1

## 2020-06-10 SURGICAL SUPPLY — 68 items
BAG URINE DRAIN 2000ML AR STRL (UROLOGICAL SUPPLIES) ×6 IMPLANT
BLADE SURG SZ11 CARB STEEL (BLADE) ×3 IMPLANT
CATH FOLEY 2WAY  5CC 16FR (CATHETERS) ×1
CATH URTH 16FR FL 2W BLN LF (CATHETERS) ×2 IMPLANT
CHLORAPREP W/TINT 26 (MISCELLANEOUS) ×3 IMPLANT
CORD MONOPOLAR M/FML 12FT (MISCELLANEOUS) ×3 IMPLANT
COUNTER NEEDLE 20/40 LG (NEEDLE) ×3 IMPLANT
COVER LIGHT HANDLE STERIS (MISCELLANEOUS) ×6 IMPLANT
COVER WAND RF STERILE (DRAPES) ×3 IMPLANT
DERMABOND ADVANCED (GAUZE/BANDAGES/DRESSINGS) ×1
DERMABOND ADVANCED .7 DNX12 (GAUZE/BANDAGES/DRESSINGS) ×2 IMPLANT
DEVICE SUTURE ENDOST 10MM (ENDOMECHANICALS) ×3 IMPLANT
DRAPE GENERAL ENDO 106X123.5 (DRAPES) ×3 IMPLANT
DRAPE STERI POUCH LG 24X46 STR (DRAPES) IMPLANT
DRAPE UNDER BUTTOCK W/FLU (DRAPES) ×3 IMPLANT
DRSG TEGADERM 2-3/8X2-3/4 SM (GAUZE/BANDAGES/DRESSINGS) ×9 IMPLANT
GLOVE BIO SURGEON STRL SZ7 (GLOVE) ×9 IMPLANT
GLOVE INDICATOR 7.5 STRL GRN (GLOVE) ×6 IMPLANT
GOWN STRL REUS W/ TWL LRG LVL3 (GOWN DISPOSABLE) ×6 IMPLANT
GOWN STRL REUS W/ TWL XL LVL3 (GOWN DISPOSABLE) ×2 IMPLANT
GOWN STRL REUS W/TWL LRG LVL3 (GOWN DISPOSABLE) ×3
GOWN STRL REUS W/TWL XL LVL3 (GOWN DISPOSABLE) ×1
GRASPER SUT TROCAR 14GX15 (MISCELLANEOUS) ×3 IMPLANT
IRRIGATION STRYKERFLOW (MISCELLANEOUS) ×2 IMPLANT
IRRIGATOR STRYKERFLOW (MISCELLANEOUS) ×3
IV NS 1000ML (IV SOLUTION) ×1
IV NS 1000ML BAXH (IV SOLUTION) ×2 IMPLANT
KIT PINK PAD W/HEAD ARE REST (MISCELLANEOUS) ×3
KIT PINK PAD W/HEAD ARM REST (MISCELLANEOUS) ×2 IMPLANT
KIT TURNOVER CYSTO (KITS) ×3 IMPLANT
L-HOOK LAP DISP 36CM (ELECTROSURGICAL)
LABEL OR SOLS (LABEL) IMPLANT
LHOOK LAP DISP 36CM (ELECTROSURGICAL) IMPLANT
LIGASURE VESSEL 5MM BLUNT TIP (ELECTROSURGICAL) ×3 IMPLANT
MANIPULATOR VCARE LG CRV RETR (MISCELLANEOUS) IMPLANT
MANIPULATOR VCARE SML CRV RETR (MISCELLANEOUS) IMPLANT
MANIPULATOR VCARE STD CRV RETR (MISCELLANEOUS) ×3 IMPLANT
NEEDLE FILTER BLUNT 18X 1/2SAF (NEEDLE) ×1
NEEDLE FILTER BLUNT 18X1 1/2 (NEEDLE) ×2 IMPLANT
NS IRRIG 500ML POUR BTL (IV SOLUTION) ×3 IMPLANT
OCCLUDER COLPOPNEUMO (BALLOONS) ×3 IMPLANT
PACK GYN LAPAROSCOPIC (MISCELLANEOUS) ×3 IMPLANT
PAD OB MATERNITY 4.3X12.25 (PERSONAL CARE ITEMS) ×3 IMPLANT
PAD PREP 24X41 OB/GYN DISP (PERSONAL CARE ITEMS) ×3 IMPLANT
PENCIL ELECTRO HAND CTR (MISCELLANEOUS) ×3 IMPLANT
POUCH SPECIMEN RETRIEVAL 10MM (ENDOMECHANICALS) IMPLANT
SCISSORS METZENBAUM CVD 33 (INSTRUMENTS) ×3 IMPLANT
SET CYSTO W/LG BORE CLAMP LF (SET/KITS/TRAYS/PACK) IMPLANT
SET TUBE SMOKE EVAC HIGH FLOW (TUBING) ×3 IMPLANT
SLEEVE ENDOPATH XCEL 5M (ENDOMECHANICALS) ×3 IMPLANT
SPONGE GAUZE 2X2 8PLY STRL LF (GAUZE/BANDAGES/DRESSINGS) ×6 IMPLANT
STRIP CLOSURE SKIN 1/4X4 (GAUZE/BANDAGES/DRESSINGS) ×3 IMPLANT
SUT ENDO VLOC 180-0-8IN (SUTURE) ×3 IMPLANT
SUT MNCRL 4-0 (SUTURE) ×1
SUT MNCRL 4-0 27XMFL (SUTURE) ×2
SUT MNCRL AB 4-0 PS2 18 (SUTURE) ×3 IMPLANT
SUT VIC AB 0 CT1 36 (SUTURE) ×6 IMPLANT
SUT VIC AB 2-0 UR6 27 (SUTURE) ×3 IMPLANT
SUT VIC AB 4-0 SH 27 (SUTURE) ×1
SUT VIC AB 4-0 SH 27XANBCTRL (SUTURE) ×2 IMPLANT
SUTURE MNCRL 4-0 27XMF (SUTURE) ×2 IMPLANT
SYR 10ML LL (SYRINGE) ×3 IMPLANT
SYR 50ML LL SCALE MARK (SYRINGE) ×3 IMPLANT
SYR 5ML LL (SYRINGE) ×3 IMPLANT
TROCAR ENDO BLADELESS 11MM (ENDOMECHANICALS) ×3 IMPLANT
TROCAR XCEL NON-BLD 5MMX100MML (ENDOMECHANICALS) ×3 IMPLANT
TUBING ART PRESS 48 MALE/FEM (TUBING) IMPLANT
TUBING EVAC SMOKE HEATED PNEUM (TUBING) ×3 IMPLANT

## 2020-06-10 NOTE — Anesthesia Preprocedure Evaluation (Signed)
Anesthesia Evaluation  Patient identified by MRN, date of birth, ID band Patient awake    Reviewed: Allergy & Precautions, NPO status , Patient's Chart, lab work & pertinent test results  History of Anesthesia Complications Negative for: history of anesthetic complications  Airway Mallampati: II  TM Distance: >3 FB Neck ROM: Full    Dental  (+) Partial Upper, Dental Advisory Given   Pulmonary neg pulmonary ROS, neg sleep apnea, neg COPD, Patient abstained from smoking.Not current smoker,    Pulmonary exam normal breath sounds clear to auscultation       Cardiovascular Exercise Tolerance: Poor METS(-) hypertension(-) CAD and (-) Past MI (-) dysrhythmias + Valvular Problems/Murmurs MVP  Rhythm:Regular Rate:Normal - Systolic murmurs Stress test with no ischemia, METS = 4.8    Neuro/Psych PSYCHIATRIC DISORDERS Anxiety negative neurological ROS     GI/Hepatic neg GERD  ,(+)     (-) substance abuse  ,   Endo/Other  neg diabetes  Renal/GU negative Renal ROS     Musculoskeletal   Abdominal   Peds  Hematology   Anesthesia Other Findings Past Medical History: No date: Anemia     Comment:  aplastic anemia No date: Anxiety No date: Dyspnea No date: Mitral valve prolapse  Reproductive/Obstetrics                             Anesthesia Physical Anesthesia Plan  ASA: II  Anesthesia Plan: General   Post-op Pain Management:    Induction: Intravenous  PONV Risk Score and Plan: 4 or greater and Ondansetron, Dexamethasone and Midazolam  Airway Management Planned: Oral ETT  Additional Equipment: None  Intra-op Plan:   Post-operative Plan: Extubation in OR  Informed Consent: I have reviewed the patients History and Physical, chart, labs and discussed the procedure including the risks, benefits and alternatives for the proposed anesthesia with the patient or authorized representative who has  indicated his/her understanding and acceptance.     Dental advisory given  Plan Discussed with: CRNA and Surgeon  Anesthesia Plan Comments: (Discussed risks of anesthesia with patient, including PONV, sore throat, lip/dental damage. Rare risks discussed as well, such as cardiorespiratory and neurological sequelae. Patient understands.)        Anesthesia Quick Evaluation

## 2020-06-10 NOTE — Anesthesia Postprocedure Evaluation (Signed)
Anesthesia Post Note  Patient: Alyssa Salazar  Procedure(s) Performed: HYSTERECTOMY TOTAL LAPAROSCOPIC (N/A ) LAPAROSCOPIC BILATERAL SALPINGECTOMY (Bilateral )  Patient location during evaluation: PACU Anesthesia Type: General Level of consciousness: awake and alert Pain management: pain level controlled Vital Signs Assessment: post-procedure vital signs reviewed and stable Respiratory status: spontaneous breathing, nonlabored ventilation, respiratory function stable and patient connected to nasal cannula oxygen Cardiovascular status: blood pressure returned to baseline and stable Postop Assessment: no apparent nausea or vomiting Anesthetic complications: no   No complications documented.   Last Vitals:  Vitals:   06/10/20 1130 06/10/20 1142  BP: (!) 150/65 (!) 160/64  Pulse: 66 60  Resp: 12 14  Temp:  (!) 36.1 C  SpO2: 100% 99%    Last Pain:  Vitals:   06/10/20 1142  TempSrc: Temporal  PainSc: 5                  Arita Miss

## 2020-06-10 NOTE — Anesthesia Procedure Notes (Signed)
Procedure Name: Intubation Date/Time: 06/10/2020 7:50 AM Performed by: Allean Found, CRNA Pre-anesthesia Checklist: Patient identified, Patient being monitored, Timeout performed, Emergency Drugs available and Suction available Patient Re-evaluated:Patient Re-evaluated prior to induction Oxygen Delivery Method: Circle system utilized Preoxygenation: Pre-oxygenation with 100% oxygen Induction Type: IV induction Ventilation: Mask ventilation without difficulty Laryngoscope Size: 3 and McGraph Grade View: Grade I Tube type: Oral Tube size: 6.5 mm Number of attempts: 1 Airway Equipment and Method: Stylet Placement Confirmation: ETT inserted through vocal cords under direct vision,  positive ETCO2 and breath sounds checked- equal and bilateral Secured at: 21 cm Tube secured with: Tape Dental Injury: Teeth and Oropharynx as per pre-operative assessment

## 2020-06-10 NOTE — Transfer of Care (Signed)
Immediate Anesthesia Transfer of Care Note  Patient: Alyssa Salazar  Procedure(s) Performed: HYSTERECTOMY TOTAL LAPAROSCOPIC (N/A ) LAPAROSCOPIC BILATERAL SALPINGECTOMY (Bilateral )  Patient Location: PACU  Anesthesia Type:General  Level of Consciousness: sedated  Airway & Oxygen Therapy: Patient Spontanous Breathing and Patient connected to face mask oxygen  Post-op Assessment: Report given to RN and Post -op Vital signs reviewed and stable  Post vital signs: Reviewed and stable  Last Vitals:  Vitals Value Taken Time  BP 161/83 06/10/20 1000  Temp 36.3 C 06/10/20 1000  Pulse 70 06/10/20 1004  Resp 20 06/10/20 1004  SpO2 100 % 06/10/20 1004  Vitals shown include unvalidated device data.  Last Pain:  Vitals:   06/10/20 1000  TempSrc:   PainSc: 0-No pain         Complications: No complications documented.

## 2020-06-10 NOTE — Interval H&P Note (Signed)
History and Physical Interval Note:  06/10/2020 7:25 AM  Alyssa Salazar  has presented today for surgery, with the diagnosis of menorrhagia, enlarged uterus.  The various methods of treatment have been discussed with the patient and family. After consideration of risks, benefits and other options for treatment, the patient has consented to  Procedure(s): HYSTERECTOMY TOTAL LAPAROSCOPIC (N/A) LAPAROSCOPIC BILATERAL SALPINGECTOMY (Bilateral) as a surgical intervention.  The patient's history has been reviewed, patient examined, no change in status, stable for surgery.  I have reviewed the patient's chart and labs.  Questions were answered to the patient's satisfaction.     Benjaman Kindler

## 2020-06-10 NOTE — Discharge Instructions (Signed)
AMBULATORY SURGERY  DISCHARGE INSTRUCTIONS   1) The drugs that you were given will stay in your system until tomorrow so for the next 24 hours you should not:  A) Drive an automobile B) Make any legal decisions C) Drink any alcoholic beverage   2) You may resume regular meals tomorrow.  Today it is better to start with liquids and gradually work up to solid foods.  You may eat anything you prefer, but it is better to start with liquids, then soup and crackers, and gradually work up to solid foods.   3) Please notify your doctor immediately if you have any unusual bleeding, trouble breathing, redness and pain at the surgery site, drainage, fever, or pain not relieved by medication.    4) Additional Instructions:        Please contact your physician with any problems or Same Day Surgery at [phone], Monday through Friday 6 am to 4 pm, or [place] [facility] number at [phone].Discharge instructions after   total laparoscopic hysterectomy   For the next three days, take ibuprofen and acetaminophen on a schedule, every 8 hours. You can take them together or you can intersperse them, and take one every four hours. I also gave you gabapentin for nighttime, to help you sleep and also to control pain. Take gabapentin medicines at night for at least the next 3 nights. You also have a narcotic, oxycodone, to take as needed if the above medicines don't help.  Postop constipation is a major cause of pain. Stay well hydrated, walk as you tolerate, and take over the counter senna as well as stool softeners if you need them.    Signs and Symptoms to Report Call our office at [phone] if you have any of the following.  . Fever over 100.4 degrees or higher . Severe stomach pain not relieved with pain medications . Bright red bleeding that's heavier than a period that does not slow with rest . To go the bathroom a lot (frequency), you can't hold your urine (urgency),  or it hurts when you empty your bladder (urinate) . Chest pain . Shortness of breath . Pain in the calves of your legs . Severe nausea and vomiting not relieved with anti-nausea medications . Signs of infection around your wounds, such as redness, hot to touch, swelling, green/yellow drainage (like pus), bad smelling discharge . Any concerns  What You Can Expect after Surgery . You may see some pink tinged, bloody fluid and bruising around the wound. This is normal. . You may notice shoulder and neck pain. This is caused by the gas used during surgery to expand your abdomen so your surgeon could get to the uterus easier. . You may have a sore throat because of the tube in your mouth during general anesthesia. This will go away in 2 to 3 days. . You may have some stomach cramps. . You may notice spotting on your panties. . You may have pain around the incision sites.   Activities after Your Discharge Follow these guidelines to help speed your recovery at home: . Do the coughing and deep breathing as you did in the hospital for 2 weeks. Use the small blue breathing device, called the incentive spirometer for 2 weeks. . Don't drive if you are in pain or taking narcotic pain medicine. You may drive when you can safely slam on the brakes, turn the wheel forcefully, and rotate your torso comfortably. This is typically 1-2 weeks. Practice in  a parking lot or side street prior to attempting to drive regularly.  . Ask others to help with household chores for 4 weeks. . Do not lift anything heavier that 10 pounds for 4-6 weeks. This includes pets, children, and groceries. . Don't do strenuous activities, exercises, or sports like vacuuming, tennis, squash, etc. until your doctor says it is safe to do so. ---Maintain pelvic rest for 8 weeks. This means nothing in the vagina or rectum at all (no douching, tampons, intercourse) for 8 weeks.  . Walk as you feel able. Rest often since it may take two or  three weeks for your energy level to return to normal.  . You may climb stairs . Avoid constipation:   -Eat fruits, vegetables, and whole grains. Eat small meals as your appetite will take time to return to normal.   -Drink 6 to 8 glasses of water each day unless your doctor has told you to limit your fluids.   -Use a laxative or stool softener as needed if constipation becomes a problem. You may take Miralax, metamucil, Citrucil, Colace, Senekot, FiberCon, etc. If this does not relieve the constipation, try two tablespoons of Milk Of Magnesia every 8 hours until your bowels move.  . You may shower. Gently wash the wounds with a mild soap and water. Pat dry. . Do not get in a hot tub, swimming pool, etc. for 6 weeks. . Do not use lotions, oils, powders on the wounds. . Do not douche, use tampons, or have sex until your doctor says it is okay. . Take your pain medicine when you need it. The medicine may not work as well if the pain is bad.  Take the medicines you were taking before surgery. Other medications you will need are pain medications and possibly constipation and nausea medications (Zofran).

## 2020-06-10 NOTE — Progress Notes (Signed)
Per Dr. Leafy Ro, pt is okay to receive ancef with low allergy to penicillin.

## 2020-06-10 NOTE — Op Note (Addendum)
Karn Pickler PROCEDURE DATE: 06/10/2020  PREOPERATIVE DIAGNOSIS: Menorrhagia, failed medical management BMI >35  POSTOPERATIVE DIAGNOSIS: The same PROCEDURE:  HYSTERECTOMY TOTAL LAPAROSCOPIC:  LAPAROSCOPIC BILATERAL SALPINGECTOMY:   SURGEON:  Dr. Benjaman Kindler ASSISTANT: Dr. Laverta Baltimore, MD  Anesthesiologist:  Anesthesiologist: Arita Miss, MD CRNA: Nelda Marseille, CRNA; Allean Found, CRNA  INDICATIONS: 49 y.o. F here for definitive surgical management secondary to the indications listed under preoperative diagnoses; please see preoperative note for further details.  Risks of surgery were discussed with the patient including but not limited to: bleeding which may require transfusion or reoperation; infection which may require antibiotics; injury to bowel, bladder, ureters or other surrounding organs; need for additional procedures; thromboembolic phenomenon, incisional problems and other postoperative/anesthesia complications. Written informed consent was obtained.    FINDINGS:  Enlarged uterus, normal ovaries and tubes (interrupted) bilaterally. Normal upper abdomen. Bleeding from right cuff angle cauterized with Klepengers and resolved with cuff closure stitch  ANESTHESIA:    General INTRAVENOUS FLUIDS:800  ml ESTIMATED BLOOD LOSS:75 ml URINE OUTPUT: 200 ml   SPECIMENS: Uterus, cervix, bilateral fallopian tubes  COMPLICATIONS: None immediate  PROCEDURE IN DETAIL:  The patient received prophalactic intravenous antibiotics and had sequential compression devices applied to her lower extremities while in the preoperative area.  She was then taken to the operating room where general anesthesia was administered and was found to be adequate.  She was placed in the dorsal lithotomy position, and was prepped and draped in a sterile manner.  A formal time out was performed with all team members present and in agreement.  A V-care uterine manipulator was placed at this time.  A Foley  catheter was inserted into her bladder and attached to constant drainage. Attention was turned to the abdomen and 0.5% Marcaine infused subq. A 75mm umbilical incision was made with the scalpel.  The Optiview 5-mm trocar and sleeve were then advanced without difficulty with the laparoscope under direct visualization into the abdomen.  The abdomen was then insufflated with carbon dioxide gas and adequate pneumoperitoneum was obtained.  A survey of the patient's pelvis and abdomen revealed the findings above.  Bilateral lower quadrant ports (5 mm on the right and 11 mm on the left) were then placed under direct visualization.  The pelvis was then carefully examined.  Attention was turned to the fallopian tubes; these were freed from the underlying mesosalpinx and the uterine attachments using the Ligasure device.  The bilateral round and broad ligaments were then clamped and transected with the Ligasure device.  The uterine artery was then skeletonized and a bladder flap was created.  The ureters were noted to be safely away from the area of dissection.  The bladder was then bluntly dissected off the lower uterine segment.    At this point, attention was turned to the uterine vessels, which were clamped and cauterized using the Ligasure on the left, and then the right. After the uterine blood flow at the level of the internal os was controlled, both arteries were cut with the Ligasure.  Good hemostasis was noted overall.  The uterosacral and cardinal ligaments were clamped, cut and ligated bilaterally .  Attention was then turned to the cervicovaginal junction, and monopolar J-hook were used to transect the cervix from the surrounding vagina using the ring of the V-care as a guide. This was done circumferentially allowing total hysterectomy.  The uterus was then removed from the vagina and the vaginal cuff incision was then closed with running V-loc suture.  Bleeding from right cuff angle cauterized with Klepengers  and resolved with cuff closure stitch Overall excellent hemostasis was noted at the close of the case.    Attention was returned to the abdomen.The ureters were reexamined bilaterally and were pulsating normally. The abdominal pressure was reduced and hemostasis was confirmed.  The 10mm port fascia was closed with a vertical mattress with 0-Vicryl, using the cone closure system. All trocars were removed under direct visualization, and the abdomen was desufflated.  All skin incisions were closed with 4-0 Vicryl subcuticular stitches and Dermabond. The patient tolerated the procedures well.  All instruments, needles, and sponge counts were correct x 2. The patient was taken to the recovery room awake, extubated and in stable condition.

## 2020-06-11 ENCOUNTER — Encounter: Payer: Self-pay | Admitting: Obstetrics and Gynecology

## 2020-06-11 LAB — SURGICAL PATHOLOGY

## 2021-03-07 ENCOUNTER — Other Ambulatory Visit: Payer: Self-pay | Admitting: Internal Medicine

## 2021-03-07 DIAGNOSIS — Z1231 Encounter for screening mammogram for malignant neoplasm of breast: Secondary | ICD-10-CM

## 2021-03-12 ENCOUNTER — Inpatient Hospital Stay: Admission: RE | Admit: 2021-03-12 | Payer: No Typology Code available for payment source | Source: Ambulatory Visit

## 2021-11-03 ENCOUNTER — Encounter: Payer: Self-pay | Admitting: *Deleted

## 2021-11-04 ENCOUNTER — Ambulatory Visit: Payer: No Typology Code available for payment source | Admitting: Anesthesiology

## 2021-11-04 ENCOUNTER — Other Ambulatory Visit: Payer: Self-pay

## 2021-11-04 ENCOUNTER — Ambulatory Visit
Admission: RE | Admit: 2021-11-04 | Discharge: 2021-11-04 | Disposition: A | Discharge status: Home / Self Care | Payer: No Typology Code available for payment source | Attending: Gastroenterology | Admitting: Gastroenterology

## 2021-11-04 ENCOUNTER — Encounter
Admission: RE | Disposition: A | Discharge status: Home / Self Care | Payer: Self-pay | Source: Home / Self Care | Attending: Gastroenterology

## 2021-11-04 ENCOUNTER — Encounter: Payer: Self-pay | Admitting: *Deleted

## 2021-11-04 DIAGNOSIS — I341 Nonrheumatic mitral (valve) prolapse: Secondary | ICD-10-CM | POA: Diagnosis not present

## 2021-11-04 DIAGNOSIS — K640 First degree hemorrhoids: Secondary | ICD-10-CM | POA: Insufficient documentation

## 2021-11-04 DIAGNOSIS — Z1211 Encounter for screening for malignant neoplasm of colon: Secondary | ICD-10-CM | POA: Insufficient documentation

## 2021-11-04 HISTORY — PX: COLONOSCOPY WITH PROPOFOL: SHX5780

## 2021-11-04 HISTORY — DX: Cardiac murmur, unspecified: R01.1

## 2021-11-04 SURGERY — COLONOSCOPY WITH PROPOFOL
Anesthesia: General

## 2021-11-04 MED ORDER — SODIUM CHLORIDE 0.9 % IV SOLN: INTRAVENOUS | Status: DC

## 2021-11-04 MED ORDER — PROPOFOL 10 MG/ML IV BOLUS
INTRAVENOUS | Status: DC | PRN
  Administered 2021-11-04: 70 mg via INTRAVENOUS

## 2021-11-04 MED ORDER — PROPOFOL 500 MG/50ML IV EMUL
INTRAVENOUS | Status: DC | PRN
  Administered 2021-11-04: 175 ug/kg/min via INTRAVENOUS

## 2021-11-04 MED ORDER — LIDOCAINE HCL (CARDIAC) PF 100 MG/5ML IV SOSY
PREFILLED_SYRINGE | INTRAVENOUS | Status: DC | PRN
  Administered 2021-11-04: 50 mg via INTRAVENOUS

## 2021-11-04 NOTE — Anesthesia Postprocedure Evaluation (Signed)
Anesthesia Post Note  Patient: Alyssa Salazar  Procedure(s) Performed: COLONOSCOPY WITH PROPOFOL  Patient location during evaluation: Phase II Anesthesia Type: General Level of consciousness: awake and alert, awake and oriented Pain management: pain level controlled Vital Signs Assessment: post-procedure vital signs reviewed and stable Respiratory status: spontaneous breathing, nonlabored ventilation and respiratory function stable Cardiovascular status: blood pressure returned to baseline and stable Postop Assessment: no apparent nausea or vomiting Anesthetic complications: no   No notable events documented.   Last Vitals:  Vitals:   11/04/21 1255 11/04/21 1305  BP: 132/82 (!) 141/85  Pulse:    Resp: 18   Temp:    SpO2:      Last Pain:  Vitals:   11/04/21 1305  TempSrc:   PainSc: 0-No pain                 Phill Mutter

## 2021-11-04 NOTE — Op Note (Signed)
[facility] [address] [facility] Patient Name: Alyssa Salazar Procedure Date: 11/04/2021 11:58 AM MRN: [id number] Account #: [id number] Date of Birth: 03-28-1971 Admit Type: Outpatient Age: 50 Room: [facility] ENDO ROOM 1 Gender: Female Note Status: Finalized Instrument Name: Jasper Riling [id number] Procedure:             Colonoscopy Indications:           Screening for colorectal malignant neoplasm Providers:             Andrey Farmer MD, MD Referring MD:          Ocie Cornfield. Ouida Sills MD, MD (Referring MD) Medicines:             Monitored Anesthesia Care Complications:         No immediate complications. Procedure:             Pre-Anesthesia Assessment:                        - Prior to the procedure, a History and Physical was                         performed, and patient medications and allergies were                         reviewed. The patient is competent. The risks and                         benefits of the procedure and the sedation options and                         risks were discussed with the patient. All questions                         were answered and informed consent was obtained.                         Patient identification and proposed procedure were                         verified by the physician, the nurse, the anesthetist                         and the technician in the endoscopy suite. Mental                         Status Examination: alert and oriented. Airway                         Examination: normal oropharyngeal airway and neck                         mobility. Respiratory Examination: clear to                         auscultation. CV Examination: normal. Prophylactic                         Antibiotics: The patient does not require prophylactic  antibiotics. Prior Anticoagulants: The patient has                         taken no previous anticoagulant or antiplatelet                         agents. ASA Grade  Assessment: II - A patient with mild                         systemic disease. After reviewing the risks and                         benefits, the patient was deemed in satisfactory                         condition to undergo the procedure. The anesthesia                         plan was to use monitored anesthesia care (MAC).                         Immediately prior to administration of medications,                         the patient was re-assessed for adequacy to receive                         sedatives. The heart rate, respiratory rate, oxygen                         saturations, blood pressure, adequacy of pulmonary                         ventilation, and response to care were monitored                         throughout the procedure. The physical status of the                         patient was re-assessed after the procedure.                        After obtaining informed consent, the colonoscope was                         passed under direct vision. Throughout the procedure,                         the patient's blood pressure, pulse, and oxygen                         saturations were monitored continuously. The                         Colonoscope was introduced through the anus and                         advanced to the the cecum, identified by appendiceal  orifice and ileocecal valve. The colonoscopy was                         somewhat difficult due to restricted mobility of the                         colon. The patient tolerated the procedure well. The                         quality of the bowel preparation was fair. Findings:      The perianal and digital rectal examinations were normal.      Internal hemorrhoids were found during retroflexion. The hemorrhoids       were Grade I (internal hemorrhoids that do not prolapse).      The exam was otherwise without abnormality on direct and retroflexion       views. Impression:            -  Preparation of the colon was fair.                        - Internal hemorrhoids.                        - The examination was otherwise normal on direct and                         retroflexion views.                        - No specimens collected. Recommendation:        - Discharge patient to home.                        - Resume previous diet.                        - Continue present medications.                        - Repeat colonoscopy in 1-2 years because the bowel                         preparation was suboptimal.                        - Return to referring physician as previously                         scheduled. Procedure Code(s):     --- Professional ---                        O1157, Colorectal cancer screening; colonoscopy on                         individual not meeting criteria for high risk Diagnosis Code(s):     --- Professional ---                        Z12.11, Encounter for screening for malignant neoplasm  of colon                        K64.0, First degree hemorrhoids CPT copyright 2019 [employer]. All rights reserved. The codes documented in this report are preliminary and upon coder review may  be revised to meet current compliance requirements. Andrey Farmer MD, MD 11/04/2021 12:33:02 PM Number of Addenda: 0 Note Initiated On: 11/04/2021 11:58 AM Scope Withdrawal Time: 0 hours 5 minutes 42 seconds  Total Procedure Duration: 0 hours 12 minutes 29 seconds  Estimated Blood Loss:  Estimated blood loss: none.      [facility]

## 2021-11-04 NOTE — Transfer of Care (Signed)
Immediate Anesthesia Transfer of Care Note  Patient: Alyssa Salazar  Procedure(s) Performed: COLONOSCOPY WITH PROPOFOL  Patient Location: PACU  Anesthesia Type:General  Level of Consciousness: awake  Airway & Oxygen Therapy: Patient Spontanous Breathing  Post-op Assessment: Report given to RN and Post -op Vital signs reviewed and stable  Post vital signs: Reviewed and stable  Last Vitals:  Vitals Value Taken Time  BP 104/53 11/04/21 1236  Temp 36.2 C 11/04/21 1235  Pulse 63 11/04/21 1241  Resp 14 11/04/21 1241  SpO2 98 % 11/04/21 1241  Vitals shown include unvalidated device data.  Last Pain:  Vitals:   11/04/21 1235  TempSrc: Temporal  PainSc: Asleep         Complications: No notable events documented.

## 2021-11-04 NOTE — H&P (Signed)
Outpatient short stay form Pre-procedure 11/04/2021  Alyssa Rubenstein, MD  Primary Physician: Kirk Ruths, MD  Reason for visit:  Screening  History of present illness:   50 y/o lady with history of hysterectomy here for screening colonoscopy. No blood thinners. No family history of GI malignancies. No new symptoms.    Current Facility-Administered Medications:    0.9 %  sodium chloride infusion, , Intravenous, Continuous, Jayda White, Hilton Cork, MD, Last Rate: 20 mL/hr at 11/04/21 1148, New Bag at 11/04/21 1148  Medications Prior to Admission  Medication Sig Dispense Refill Last Dose   escitalopram (LEXAPRO) 10 MG tablet Take 10 mg by mouth daily at 8 pm.   11/03/2021   meloxicam (MOBIC) 7.5 MG tablet Take 7.5 mg by mouth daily.   Past Week   naproxen sodium (ALEVE) 220 MG tablet Take 440 mg by mouth 2 (two) times daily as needed (pain.).   Past Week   acetaminophen (TYLENOL) 325 MG tablet Take 650 mg by mouth every 6 (six) hours as needed.      docusate sodium (COLACE) 100 MG capsule Take 1 capsule (100 mg total) by mouth 2 (two) times daily. To keep stools soft 30 capsule 0    gabapentin (NEURONTIN) 800 MG tablet Take 1 tablet (800 mg total) by mouth at bedtime for 14 days. Take nightly for 3 days, then up to 14 days as needed 14 tablet 0    norethindrone (AYGESTIN) 5 MG tablet Take 5 mg by mouth daily at 8 pm. (Patient not taking: Reported on 11/04/2021)   Not Taking   oxyCODONE (OXY IR/ROXICODONE) 5 MG immediate release tablet Take 1 tablet (5 mg total) by mouth every 4 (four) hours as needed for severe pain. 20 tablet 0    valACYclovir (VALTREX) 1000 MG tablet Take 1,000 mg by mouth 3 (three) times daily as needed (outbreaks).         Allergies  Allergen Reactions   Contrast Media [Iodinated Diagnostic Agents] Hives and Nausea And Vomiting   Penicillins Nausea And Vomiting and Rash     Past Medical History:  Diagnosis Date   Anemia    aplastic anemia   Anxiety     Dyspnea    Heart murmur    Mitral valve prolapse     Review of systems:  Otherwise negative.    Physical Exam  Gen: Alert, oriented. Appears stated age.  HEENT: PERRLA. Lungs: No respiratory distress Abd: soft, benign, no masses Ext: No edema    Planned procedures: Proceed with colonoscopy. The patient understands the nature of the planned procedure, indications, risks, alternatives and potential complications including but not limited to bleeding, infection, perforation, damage to internal organs and possible oversedation/side effects from anesthesia. The patient agrees and gives consent to proceed.  Please refer to procedure notes for findings, recommendations and patient disposition/instructions.     Alyssa Rubenstein, MD [facility]

## 2021-11-04 NOTE — Interval H&P Note (Signed)
History and Physical Interval Note:  11/04/2021 11:52 AM  Alyssa Salazar  has presented today for surgery, with the diagnosis of Z12.11 Colon Cancer Screening.  The various methods of treatment have been discussed with the patient and family. After consideration of risks, benefits and other options for treatment, the patient has consented to  Procedure(s): COLONOSCOPY WITH PROPOFOL (N/A) as a surgical intervention.  The patient's history has been reviewed, patient examined, no change in status, stable for surgery.  I have reviewed the patient's chart and labs.  Questions were answered to the patient's satisfaction.     Lesly Rubenstein  Ok to proceed with colonoscopy

## 2021-11-04 NOTE — Anesthesia Preprocedure Evaluation (Signed)
Anesthesia Evaluation  Patient identified by MRN, date of birth, ID band Patient awake    Reviewed: Allergy & Precautions, NPO status , Patient's Chart, lab work & pertinent test results  History of Anesthesia Complications Negative for: history of anesthetic complications  Airway Mallampati: II  TM Distance: >3 FB Neck ROM: Full    Dental  (+) Partial Upper   Pulmonary shortness of breath and with exertion, neg sleep apnea, neg COPD, Patient abstained from smoking.Not current smoker,    Pulmonary exam normal breath sounds clear to auscultation       Cardiovascular Exercise Tolerance: Poor METS(-) hypertension(-) CAD and (-) Past MI (-) dysrhythmias + Valvular Problems/Murmurs MVP  Rhythm:Regular Rate:Normal - Systolic murmurs Stress test with no ischemia, METS = 4.8    Neuro/Psych PSYCHIATRIC DISORDERS Anxiety negative neurological ROS     GI/Hepatic neg GERD  ,(+)     (-) substance abuse  ,   Endo/Other  neg diabetes  Renal/GU negative Renal ROS     Musculoskeletal   Abdominal   Peds  Hematology  (+) anemia ,   Anesthesia Other Findings Past Medical History: No date: Anemia     Comment:  aplastic anemia No date: Anxiety No date: Dyspnea No date: Mitral valve prolapse  Reproductive/Obstetrics                            Anesthesia Physical  Anesthesia Plan  ASA: 2  Anesthesia Plan: General   Post-op Pain Management:    Induction: Intravenous  PONV Risk Score and Plan: 3 and TIVA and Propofol infusion  Airway Management Planned: Natural Airway and Nasal Cannula  Additional Equipment:   Intra-op Plan:   Post-operative Plan:   Informed Consent: I have reviewed the patients History and Physical, chart, labs and discussed the procedure including the risks, benefits and alternatives for the proposed anesthesia with the patient or authorized representative who has indicated  his/her understanding and acceptance.       Plan Discussed with: CRNA, Surgeon and Anesthesiologist  Anesthesia Plan Comments: (Discussed risks of anesthesia with patient, including PONV, sore throat, lip/dental damage. Rare risks discussed as well, such as cardiorespiratory and neurological sequelae. Patient understands.)       Anesthesia Quick Evaluation

## 2021-11-04 NOTE — Anesthesia Procedure Notes (Signed)
Date/Time: 11/04/2021 12:02 PM Performed by: Johnna Acosta, CRNA Pre-anesthesia Checklist: Patient identified, Emergency Drugs available, Suction available, Patient being monitored and Timeout performed Patient Re-evaluated:Patient Re-evaluated prior to induction Oxygen Delivery Method: Nasal cannula Preoxygenation: Pre-oxygenation with 100% oxygen Induction Type: IV induction

## 2021-11-05 ENCOUNTER — Encounter: Payer: Self-pay | Admitting: Gastroenterology

## 2022-01-13 ENCOUNTER — Other Ambulatory Visit: Payer: Self-pay | Admitting: Internal Medicine

## 2022-01-13 DIAGNOSIS — Z1231 Encounter for screening mammogram for malignant neoplasm of breast: Secondary | ICD-10-CM

## 2022-06-26 ENCOUNTER — Emergency Department: Payer: No Typology Code available for payment source

## 2022-06-26 DIAGNOSIS — X58XXXA Exposure to other specified factors, initial encounter: Secondary | ICD-10-CM | POA: Insufficient documentation

## 2022-06-26 DIAGNOSIS — S63502A Unspecified sprain of left wrist, initial encounter: Secondary | ICD-10-CM | POA: Insufficient documentation

## 2022-06-26 DIAGNOSIS — S6992XA Unspecified injury of left wrist, hand and finger(s), initial encounter: Secondary | ICD-10-CM | POA: Diagnosis present

## 2022-06-26 NOTE — ED Triage Notes (Signed)
Pt states she woke up this morning with worsening left wrist pain. Strong radial pulse.

## 2022-06-27 ENCOUNTER — Emergency Department
Admission: EM | Admit: 2022-06-27 | Discharge: 2022-06-27 | Disposition: A | Discharge status: Home / Self Care | Payer: No Typology Code available for payment source | Attending: Emergency Medicine | Admitting: Emergency Medicine

## 2022-06-27 DIAGNOSIS — S63502A Unspecified sprain of left wrist, initial encounter: Secondary | ICD-10-CM

## 2022-06-27 DIAGNOSIS — M25532 Pain in left wrist: Secondary | ICD-10-CM

## 2022-06-27 NOTE — Discharge Instructions (Signed)
Use Tylenol for pain and fevers.  Up to 1000 mg per dose, up to 4 times per day.  Do not take more than 4000 mg of Tylenol/acetaminophen within 24 hours..  Use naproxen/Aleve for anti-inflammatory pain relief. Use up to 500mg every 12 hours. Do not take more frequently than this. Do not use other NSAIDs (ibuprofen, Advil) while taking this medication. It is safe to take Tylenol with this.   

## 2022-06-27 NOTE — ED Provider Notes (Signed)
[facility] Provider Note    Event Date/Time   First MD Initiated Contact with Patient 06/27/22 0206     (approximate)   History   Wrist Pain   HPI  Alyssa Salazar is a 51 y.o. female who presents to the ED for evaluation of Wrist Pain   I reviewed PCP visit from 6/29.  History of depression and anxiety.  Patient presents to the ED for evaluation of left dorsal wrist pain for about 24 hours.  She reports awakening Friday morning with this discomfort and has been present since that time, primarily with dorsiflexion and palpation to the dorsum of the left wrist.  Denies any discrete injuries, falls or acute events.  Reports she felt fine when going to bed Thursday night.   She presents to the ED at the urging of her husband for evaluation of this pain.   Physical Exam   Triage Vital Signs: ED Triage Vitals [06/26/22 2252]  Enc Vitals Group     BP (!) 139/57     Pulse Rate 75     Resp 17     Temp 98.9 F (37.2 C)     Temp Source Oral     SpO2 98 %     Weight      Height      Head Circumference      Peak Flow      Pain Score 7     Pain Loc      Pain Edu?      Excl. in [place]?     Most recent vital signs: Vitals:   06/26/22 2252 06/27/22 0229  BP: (!) 139/57 132/66  Pulse: 75 63  Resp: 17 16  Temp: 98.9 F (37.2 C) 98.3 F (36.8 C)  SpO2: 98% 99%    General: Awake, no distress.  CV:  Good peripheral perfusion.  Resp:  Normal effort.  Abd:  No distention.  MSK:  Minimal soft tissue prominence noted to the dorsum of the left wrist around her site of pain.  No overlying skin changes, erythema or fluctuance.  She is somewhat tender here and this reproduces her symptoms.  Distally, to the hands and fingers she has no tenderness, signs of trauma, neurologic or vascular deficits.  No elbow or more proximal deformity, tenderness or impaired range of motion. Neuro:  No focal deficits appreciated. Other:     ED Results / Procedures /  Treatments   Labs (all labs ordered are listed, but only abnormal results are displayed) Labs Reviewed - No data to display  EKG   RADIOLOGY Plain film of the left wrist interpreted by me without evidence of acute fracture or dislocation  Official radiology report(s): DG Wrist Complete Left  Result Date: 06/26/2022 CLINICAL DATA:  Left wrist pain EXAM: LEFT WRIST - COMPLETE 3+ VIEW COMPARISON:  None Available. FINDINGS: Frontal, oblique, lateral, and ulnar deviated views of the left wrist are obtained. No acute displaced fracture, subluxation, or dislocation. Joint spaces are well preserved. Soft tissues are unremarkable. IMPRESSION: 1. Unremarkable left wrist. Electronically Signed   By: Randa Ngo M.D.   On: 06/26/2022 23:14    PROCEDURES and INTERVENTIONS:  Procedures  Medications - No data to display   IMPRESSION / MDM / [place] / ED COURSE  I reviewed the triage vital signs and the nursing notes.  Differential diagnosis includes, but is not limited to, fracture, dislocation, cellulitis, acute ischemia  51 year old female presents to the ED  with about 24 hours of atraumatic dorsal left wrist pain, possibly soft tissue strain or inflammation of the extensor retinaculum, ultimately suitable for outpatient management.  She looks systemically well.  No concerning features such as neurologic or vascular deficits.  No signs of trauma or deformity beyond some minimal soft tissue prominence over the dorsal wrist.  X-rays reassuring without evidence of fracture or dislocation.  We discussed conservative measures, RICE therapy and orthopedic follow-up.      FINAL CLINICAL IMPRESSION(S) / ED DIAGNOSES   Final diagnoses:  Sprain of left wrist, initial encounter  Left wrist pain     Rx / DC Orders   ED Discharge Orders     None        Note:  This document was prepared using Dragon voice recognition software and may include unintentional dictation errors.    Vladimir Crofts, MD 06/27/22 [phone]

## 2022-06-27 NOTE — ED Notes (Signed)
Left wrist brace applied to pt. Pt was spoken to about rest, elevation, compression, and ice. Pt was instructed on OTC meds to take for pain relief. Pt was ambulatory at discharge with daughter.

## 2023-03-09 ENCOUNTER — Other Ambulatory Visit: Payer: Self-pay | Admitting: Internal Medicine

## 2023-03-09 DIAGNOSIS — Z1231 Encounter for screening mammogram for malignant neoplasm of breast: Secondary | ICD-10-CM

## 2023-03-15 ENCOUNTER — Ambulatory Visit: Payer: No Typology Code available for payment source

## 2024-03-14 ENCOUNTER — Other Ambulatory Visit: Payer: Self-pay | Admitting: Internal Medicine

## 2024-03-14 DIAGNOSIS — Z1231 Encounter for screening mammogram for malignant neoplasm of breast: Secondary | ICD-10-CM

## 2024-03-15 ENCOUNTER — Ambulatory Visit
Admission: RE | Admit: 2024-03-15 | Discharge: 2024-03-15 | Disposition: A | Discharge status: Home / Self Care | Source: Ambulatory Visit | Attending: Internal Medicine | Admitting: Internal Medicine

## 2024-03-15 DIAGNOSIS — Z1231 Encounter for screening mammogram for malignant neoplasm of breast: Secondary | ICD-10-CM | POA: Insufficient documentation

## 2024-08-16 ENCOUNTER — Ambulatory Visit: Attending: Internal Medicine

## 2024-08-16 DIAGNOSIS — G4710 Hypersomnia, unspecified: Secondary | ICD-10-CM | POA: Diagnosis present

## 2024-08-16 DIAGNOSIS — E669 Obesity, unspecified: Secondary | ICD-10-CM | POA: Insufficient documentation

## 2024-08-16 DIAGNOSIS — I10 Essential (primary) hypertension: Secondary | ICD-10-CM | POA: Insufficient documentation

## 2024-08-16 DIAGNOSIS — Z6835 Body mass index (BMI) 35.0-35.9, adult: Secondary | ICD-10-CM | POA: Diagnosis not present
